# Patient Record
Sex: Male | Born: 2000 | Race: Black or African American | Hispanic: No | Marital: Single
Health system: Southern US, Community
[De-identification: ages and names within clinical notes are randomized; demographics above are authoritative.]

## PROBLEM LIST (undated history)

## (undated) DIAGNOSIS — J45909 Unspecified asthma, uncomplicated: Secondary | ICD-10-CM

## (undated) DIAGNOSIS — R569 Unspecified convulsions: Secondary | ICD-10-CM

## (undated) DIAGNOSIS — G039 Meningitis, unspecified: Secondary | ICD-10-CM

## (undated) HISTORY — PX: LUMBAR PUNCTURE: SHX1985

---
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
## 2021-08-25 ENCOUNTER — Ambulatory Visit: Payer: BC Managed Care – PPO

## 2022-04-28 ENCOUNTER — Emergency Department: Payer: BC Managed Care – PPO

## 2022-04-28 ENCOUNTER — Emergency Department
Admission: EM | Admit: 2022-04-28 | Discharge: 2022-04-28 | Disposition: A | Discharge status: Home / Self Care | Payer: BC Managed Care – PPO | Attending: Emergency Medicine | Admitting: Emergency Medicine

## 2022-04-28 ENCOUNTER — Other Ambulatory Visit: Payer: Self-pay

## 2022-04-28 DIAGNOSIS — M7918 Myalgia, other site: Secondary | ICD-10-CM | POA: Diagnosis not present

## 2022-04-28 DIAGNOSIS — T608X1A Toxic effect of other pesticides, accidental (unintentional), initial encounter: Secondary | ICD-10-CM | POA: Insufficient documentation

## 2022-04-28 DIAGNOSIS — R258 Other abnormal involuntary movements: Secondary | ICD-10-CM | POA: Insufficient documentation

## 2022-04-28 DIAGNOSIS — R569 Unspecified convulsions: Secondary | ICD-10-CM

## 2022-04-28 DIAGNOSIS — T6091XA Toxic effect of unspecified pesticide, accidental (unintentional), initial encounter: Secondary | ICD-10-CM

## 2022-04-28 LAB — BASIC METABOLIC PANEL
Anion gap: 10 (ref 5–15)
BUN: 13 mg/dL (ref 6–20)
CO2: 26 mmol/L (ref 22–32)
Calcium: 9.4 mg/dL (ref 8.9–10.3)
Chloride: 105 mmol/L (ref 98–111)
Creatinine, Ser: 1.16 mg/dL (ref 0.61–1.24)
GFR, Estimated: >60 mL/min (ref >60)
Glucose, Bld: 97 mg/dL (ref 70–99)
Potassium: 3.7 mmol/L (ref 3.5–5.1)
Sodium: 141 mmol/L (ref 135–145)

## 2022-04-28 LAB — URINALYSIS, ROUTINE W REFLEX MICROSCOPIC
Bacteria, UA: NONE SEEN
Bilirubin Urine: NEGATIVE
Glucose, UA: NEGATIVE mg/dL
Hgb urine dipstick: NEGATIVE
Ketones, ur: NEGATIVE mg/dL
Leukocytes,Ua: NEGATIVE
Nitrite: NEGATIVE
Protein, ur: 30 mg/dL — AB
Specific Gravity, Urine: 1.031 — ABNORMAL HIGH (ref 1.005–1.030)
Squamous Epithelial / HPF: NONE SEEN (ref 0–5)
pH: 7 (ref 5.0–8.0)

## 2022-04-28 LAB — CBC
HCT: 45.2 % (ref 39.0–52.0)
Hemoglobin: 15.3 g/dL (ref 13.0–17.0)
MCH: 29.9 pg (ref 26.0–34.0)
MCHC: 33.8 g/dL (ref 30.0–36.0)
MCV: 88.5 fL (ref 80.0–100.0)
Platelets: 229 10*3/uL (ref 150–400)
RBC: 5.11 MIL/uL (ref 4.22–5.81)
RDW: 11.9 % (ref 11.5–15.5)
WBC: 6.2 10*3/uL (ref 4.0–10.5)
nRBC: 0 % (ref 0.0–0.2)

## 2022-04-28 LAB — URINE DRUG SCREEN, QUALITATIVE (ARMC ONLY)
Amphetamines, Ur Screen: NOT DETECTED
Barbiturates, Ur Screen: NOT DETECTED
Benzodiazepine, Ur Scrn: NOT DETECTED
Cannabinoid 50 Ng, Ur ~~LOC~~: POSITIVE — AB
Cocaine Metabolite,Ur ~~LOC~~: NOT DETECTED
MDMA (Ecstasy)Ur Screen: NOT DETECTED
Methadone Scn, Ur: NOT DETECTED
Opiate, Ur Screen: NOT DETECTED
Phencyclidine (PCP) Ur S: NOT DETECTED
Tricyclic, Ur Screen: NOT DETECTED

## 2022-04-28 MED ORDER — LEVETIRACETAM 500 MG PO TABS: 500.0000 mg | ORAL_TABLET | Freq: Two times a day (BID) | ORAL | 0 refills | Status: AC

## 2022-04-28 MED ORDER — LEVETIRACETAM 500 MG PO TABS
500.0000 mg | ORAL_TABLET | Freq: Once | ORAL | Status: AC
  Administered 2022-04-28: 500 mg via ORAL
  Filled 2022-04-28: qty 1

## 2022-04-28 NOTE — Discharge Instructions (Signed)
Please use ibuprofen (Motrin) up to 800 mg every 8 hours, naproxen (Naprosyn) up to 500 mg every 12 hours, and/or acetaminophen (Tylenol) up to 4 g/day for any continued pain 

## 2022-04-28 NOTE — ED Provider Notes (Addendum)
[facility] Provider Note   Event Date/Time   First MD Initiated Contact with Patient 04/28/22 1658     (approximate) History  Loss of Consciousness  HPI Clinton Lane is a 21 y.o. 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  Family states that they set off a "bug bomb" in the house that caused an insecticide to be fogged all over.  Patient states that he was smoking marijuana and wanted to stay in the fog as he did not want to smoke outside.  Family then states that they heard patient yell from upstairs and found him on the ground having shaking activity in his upper and lower extremities as well as drooling from the mouth.  Per family, patient had slow return to neurologic baseline over the next 10-15 minutes and states that this was similar activity to the seizures that he had had as a child.  Patient now only complains of significant muscle pain worse in his thoracic and lumbar paraspinal musculature as he believes he fell backwards onto his back. ROS: Patient currently denies any vision changes, tinnitus, difficulty speaking, facial droop, sore throat, chest pain, shortness of breath, abdominal pain, nausea/vomiting/diarrhea, dysuria, or weakness/numbness/paresthesias in any extremity   Physical Exam  Triage Vital Signs: ED Triage Vitals  Enc Vitals Group     BP 04/28/22 1548 122/76     Pulse Rate 04/28/22 1548 70     Resp 04/28/22 1548 18     Temp 04/28/22 1548 98.5 F (36.9 C)     Temp Source 04/28/22 1548 Oral     SpO2 04/28/22 1548 95 %     Weight 04/28/22 1547 180 lb (81.6 kg)     Height 04/28/22 1547 6' (1.829 m)     Head Circumference --      Peak Flow --      Pain Score 04/28/22 1547 6     Pain Loc --      Pain Edu? --      Excl. in [place]? --    Most recent vital signs: Vitals:   04/28/22 1548  BP: 122/76  Pulse: 70  Resp: 18  Temp:  98.5 F (36.9 C)  SpO2: 95%   General: Awake, oriented x4. CV:  Good peripheral perfusion.  Resp:  Normal effort.  Abd:  No distention.  Other:  Young adult African-American male laying in bed in no acute distress.  NIHSS is 0 ED Results / Procedures / Treatments  Labs (all labs ordered are listed, but only abnormal results are displayed) Labs Reviewed  URINALYSIS, ROUTINE W REFLEX MICROSCOPIC - Abnormal; Notable for the following components:      Result Value   Color, Urine AMBER (*)    APPearance HAZY (*)    Specific Gravity, Urine 1.031 (*)    Protein, ur 30 (*)    All other components within normal limits  URINE DRUG SCREEN, QUALITATIVE ([facility] ONLY) - Abnormal; Notable for the following components:   Cannabinoid 50 Ng, Ur Lee POSITIVE (*)    All other components within normal limits  BASIC METABOLIC PANEL  CBC   RADIOLOGY ED MD interpretation: CT of the head without contrast interpreted by me shows no evidence of acute abnormalities including no intracerebral hemorrhage, obvious masses, or significant edema -Agree with radiology assessment Official radiology report(s): CT Head Wo Contrast  Result Date: 04/28/2022 CLINICAL DATA:  Headache, new  or worsening, post traumatic (Age 3-49y); Neck trauma, midline tenderness (Age 62-64y) EXAM: CT HEAD WITHOUT CONTRAST CT CERVICAL SPINE WITHOUT CONTRAST TECHNIQUE: Multidetector CT imaging of the head and cervical spine was performed following the standard protocol without intravenous contrast. Multiplanar CT image reconstructions of the cervical spine were also generated. RADIATION DOSE REDUCTION: This exam was performed according to the departmental dose-optimization program which includes automated exposure control, adjustment of the mA and/or kV according to patient size and/or use of iterative reconstruction technique. COMPARISON:  None Available. FINDINGS: CT HEAD FINDINGS Brain: No evidence of acute infarction, hemorrhage, or  hydrocephalus. 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 Vascular: No hyperdense vessel identified. Skull: No acute fracture. Sinuses/Orbits: Scattered paranasal sinus mucosal thickening. Other: No mastoid effusions. CT CERVICAL SPINE FINDINGS Alignment: Reversal the normal cervical lordosis. No substantial sagittal subluxation. Skull base and vertebrae: Vertebral body heights are maintained. No evidence of acute fracture. Soft tissues and spinal canal: No prevertebral fluid or swelling. No visible canal hematoma. Disc levels:  No significant focal bony degenerative change. Upper chest: Visualized lung apices are clear. IMPRESSION: 1. No evidence of acute intracranial abnormality. 2. No evidence of acute fracture or traumatic malalignment in the cervical spine. Electronically Signed   By: Feliberto Harts M.D.   On: 04/28/2022 16:18   CT Cervical Spine Wo Contrast  Result Date: 04/28/2022 CLINICAL DATA:  Headache, new or worsening, post traumatic (Age 57-49y); Neck trauma, midline tenderness (Age 72-64y) EXAM: CT HEAD WITHOUT CONTRAST CT CERVICAL SPINE WITHOUT CONTRAST TECHNIQUE: Multidetector CT imaging of the head and cervical spine was performed following the standard protocol without intravenous contrast. Multiplanar CT image reconstructions of the cervical spine were also generated. RADIATION DOSE REDUCTION: This exam was performed according to the departmental dose-optimization program which includes automated exposure control, adjustment of the mA and/or kV according to patient size and/or use of iterative reconstruction technique. COMPARISON:  None Available. FINDINGS: CT HEAD FINDINGS Brain: No evidence of acute infarction, hemorrhage, or hydrocephalus. 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 Vascular: No hyperdense vessel identified.  Skull: No acute fracture. Sinuses/Orbits: Scattered paranasal sinus mucosal thickening. Other: No mastoid effusions. CT CERVICAL SPINE FINDINGS Alignment: Reversal the normal cervical lordosis. No substantial sagittal subluxation. Skull base and vertebrae: Vertebral body heights are maintained. No evidence of acute fracture. Soft tissues and spinal canal: No prevertebral fluid or swelling. No visible canal hematoma. Disc levels:  No significant focal bony degenerative change. Upper chest: Visualized lung apices are clear. IMPRESSION: 1. No evidence of acute intracranial abnormality. 2. No evidence of acute fracture or traumatic malalignment in the cervical spine. Electronically Signed   By: Feliberto Harts M.D.   On: 04/28/2022 16:18   PROCEDURES: Critical Care performed: No .1-3 Lead EKG Interpretation  Performed by: Merwyn Katos, MD Authorized by: Merwyn Katos, MD     Interpretation: normal     ECG rate:  71   ECG rate assessment: normal     Rhythm: sinus rhythm     Ectopy: none     Conduction: normal    MEDICATIONS ORDERED IN ED: Medications  levETIRAcetam (KEPPRA) tablet 500 mg (has no administration in time range)   IMPRESSION / MDM / ASSESSMENT AND PLAN / ED COURSE  I reviewed the triage vital signs and the nursing notes.  The patient is on the cardiac monitor to evaluate for evidence of arrhythmia and/or significant heart rate changes. Patient's presentation is most consistent with acute presentation with potential threat to life or bodily function. Patient presents after recent seizure episode.  Patient had slow return to baseline mental and physical function per family. No immunosuppresion hx and had no preceding fever. No history of alcohol abuse or suspicion for toxin ingestion. Unlikely stroke, syncope. Unlikely infectious etiology. No preceding trauma.  Workup: EKG, BMP, POCT glucose (pregnancy test if male) and CT Brain.  Field  Interventions: None ED Interventions: 500 Keppra p.o.  In speaking to poison control, who they did relay that these types of insecticide fog can lower seizure threshold in people who are prone to seizures and recommend seizure prophylaxis for 1 week after exposure. Disposition: Discharge home with primary care follow up in next 24-48 hours.   FINAL CLINICAL IMPRESSION(S) / ED DIAGNOSES   Final diagnoses:  Seizure-like activity (HCC)  Toxic effect of insecticide, accidental or unintentional, initial encounter   Rx / DC Orders   ED Discharge Orders          Ordered    levETIRAcetam (KEPPRA) 500 MG tablet  2 times daily        04/28/22 1820           Note:  This document was prepared using Dragon voice recognition software and may include unintentional dictation errors.   Merwyn Katos, MD 04/28/22 Serena Croissant    Merwyn Katos, MD 04/28/22 [phone]

## 2022-04-28 NOTE — ED Triage Notes (Signed)
First nurse note: Patient arrived by EMS from home for syncopal episode. FD reports finding patient unresponsive. Responsive when EMS arrived. A&O x4. Patient reports smoking 2 grams marijuana.   61HR 94% RA 118/75 blood pressure 93 blood glucose

## 2022-04-28 NOTE — ED Provider Triage Note (Signed)
Emergency Medicine Provider Triage Evaluation Note  Clinton Lane , a 21 y.o. male  was evaluated in triage.  Pt complains of headache and neck pain after syncopal episode at home this afternoon. Mom states he was out about 5 minutes then woke up and had foam around his mouth. Seizure history as a child. Now has headache and neck pain.  Physical Exam  There were no vitals taken for this visit. Gen:   Awake, no distress   Resp:  Normal effort  MSK:   Moves extremities without difficulty  Other:    Medical Decision Making  Medically screening exam initiated at 3:47 PM.  Appropriate orders placed.  Clinton Lane was informed that the remainder of the evaluation will be completed by another provider, this initial triage assessment does not replace that evaluation, and the importance of remaining in the ED until their evaluation is complete.    Chinita Pester, FNP 04/28/22 1550

## 2022-04-28 NOTE — ED Triage Notes (Signed)
Pt arrives via EMS for a syncopal episode that lasted about 5 minutes per family- per family pt did having some shaking and foaming at the mouth- pt had seizures when he was younger but has not had any in a long time- pt having pain in his neck from the fall

## 2022-05-25 ENCOUNTER — Emergency Department: Payer: BC Managed Care – PPO

## 2022-05-25 ENCOUNTER — Emergency Department
Admission: EM | Admit: 2022-05-25 | Discharge: 2022-05-25 | Disposition: A | Discharge status: Home / Self Care | Payer: BC Managed Care – PPO | Attending: Emergency Medicine | Admitting: Emergency Medicine

## 2022-05-25 ENCOUNTER — Other Ambulatory Visit: Payer: Self-pay

## 2022-05-25 DIAGNOSIS — R4182 Altered mental status, unspecified: Secondary | ICD-10-CM | POA: Insufficient documentation

## 2022-05-25 DIAGNOSIS — Z20822 Contact with and (suspected) exposure to covid-19: Secondary | ICD-10-CM | POA: Insufficient documentation

## 2022-05-25 DIAGNOSIS — R55 Syncope and collapse: Secondary | ICD-10-CM | POA: Diagnosis present

## 2022-05-25 LAB — URINE DRUG SCREEN, QUALITATIVE (ARMC ONLY)
Amphetamines, Ur Screen: NOT DETECTED
Barbiturates, Ur Screen: NOT DETECTED
Benzodiazepine, Ur Scrn: NOT DETECTED
Cannabinoid 50 Ng, Ur ~~LOC~~: POSITIVE — AB
Cocaine Metabolite,Ur ~~LOC~~: NOT DETECTED
MDMA (Ecstasy)Ur Screen: NOT DETECTED
Methadone Scn, Ur: NOT DETECTED
Opiate, Ur Screen: NOT DETECTED
Phencyclidine (PCP) Ur S: NOT DETECTED
Tricyclic, Ur Screen: NOT DETECTED

## 2022-05-25 LAB — CBC WITH DIFFERENTIAL/PLATELET
Abs Immature Granulocytes: 0.04 10*3/uL (ref 0.00–0.07)
Basophils Absolute: 0 10*3/uL (ref 0.0–0.1)
Basophils Relative: 1 %
Eosinophils Absolute: 0.2 10*3/uL (ref 0.0–0.5)
Eosinophils Relative: 3 %
HCT: 45.4 % (ref 39.0–52.0)
Hemoglobin: 15 g/dL (ref 13.0–17.0)
Immature Granulocytes: 1 %
Lymphocytes Relative: 43 %
Lymphs Abs: 2.7 10*3/uL (ref 0.7–4.0)
MCH: 29.7 pg (ref 26.0–34.0)
MCHC: 33 g/dL (ref 30.0–36.0)
MCV: 89.9 fL (ref 80.0–100.0)
Monocytes Absolute: 0.5 10*3/uL (ref 0.1–1.0)
Monocytes Relative: 8 %
Neutro Abs: 2.6 10*3/uL (ref 1.7–7.7)
Neutrophils Relative %: 44 %
Platelets: 202 10*3/uL (ref 150–400)
RBC: 5.05 MIL/uL (ref 4.22–5.81)
RDW: 12.5 % (ref 11.5–15.5)
WBC: 6 10*3/uL (ref 4.0–10.5)
nRBC: 0 % (ref 0.0–0.2)

## 2022-05-25 LAB — COMPREHENSIVE METABOLIC PANEL
ALT: 27 U/L (ref 0–44)
AST: 29 U/L (ref 15–41)
Albumin: 4.3 g/dL (ref 3.5–5.0)
Alkaline Phosphatase: 43 U/L (ref 38–126)
Anion gap: 7 (ref 5–15)
BUN: 13 mg/dL (ref 6–20)
CO2: 26 mmol/L (ref 22–32)
Calcium: 9.3 mg/dL (ref 8.9–10.3)
Chloride: 110 mmol/L (ref 98–111)
Creatinine, Ser: 1.04 mg/dL (ref 0.61–1.24)
GFR, Estimated: >60 mL/min (ref >60)
Glucose, Bld: 95 mg/dL (ref 70–99)
Potassium: 3.4 mmol/L — ABNORMAL LOW (ref 3.5–5.1)
Sodium: 143 mmol/L (ref 135–145)
Total Bilirubin: 0.5 mg/dL (ref 0.3–1.2)
Total Protein: 7.3 g/dL (ref 6.5–8.1)

## 2022-05-25 LAB — URINALYSIS, COMPLETE (UACMP) WITH MICROSCOPIC
Bilirubin Urine: NEGATIVE
Glucose, UA: NEGATIVE mg/dL
Hgb urine dipstick: NEGATIVE
Ketones, ur: NEGATIVE mg/dL
Leukocytes,Ua: NEGATIVE
Nitrite: NEGATIVE
Protein, ur: 30 mg/dL — AB
Specific Gravity, Urine: 1.028 (ref 1.005–1.030)
pH: 6 (ref 5.0–8.0)

## 2022-05-25 LAB — LACTIC ACID, PLASMA: Lactic Acid, Venous: 1.3 mmol/L (ref 0.5–1.9)

## 2022-05-25 LAB — ETHANOL: Alcohol, Ethyl (B): <10 mg/dL (ref <10)

## 2022-05-25 LAB — PROTIME-INR
INR: 1.1 (ref 0.8–1.2)
Prothrombin Time: 13.6 seconds (ref 11.4–15.2)

## 2022-05-25 LAB — APTT: aPTT: 31 seconds (ref 24–36)

## 2022-05-25 LAB — RESP PANEL BY RT-PCR (FLU A&B, COVID) ARPGX2
Influenza A by PCR: NEGATIVE
Influenza B by PCR: NEGATIVE
SARS Coronavirus 2 by RT PCR: NEGATIVE

## 2022-05-25 MED ORDER — ACETAMINOPHEN 500 MG PO TABS
1000.0000 mg | ORAL_TABLET | Freq: Once | ORAL | Status: AC
Start: 2022-05-25 — End: 2022-05-25
  Administered 2022-05-25: 1000 mg via ORAL
  Filled 2022-05-25: qty 2

## 2022-05-25 MED ORDER — LACTATED RINGERS IV BOLUS
1000.0000 mL | Freq: Once | INTRAVENOUS | Status: AC
  Administered 2022-05-25: 1000 mL via INTRAVENOUS

## 2022-05-25 MED ORDER — LACTATED RINGERS IV BOLUS (SEPSIS)
1000.0000 mL | Freq: Once | INTRAVENOUS | Status: AC
Start: 2022-05-25 — End: 2022-05-25
  Administered 2022-05-25: 1000 mL via INTRAVENOUS

## 2022-05-25 NOTE — ED Provider Notes (Signed)
Procedures     ----------------------------------------- 7:09 PM on 05/25/2022 ----------------------------------------- Patient's mental status has quickly normalized with IV fluids in the ED.  Further and additional observation period of a few hours, he is remained normal.  He is tolerating oral intake.  Mental status and speech are normal, no meningismus.  No fever.  He feels back to normal.  CT head is unremarkable.  Labs unremarkable.  COVID and flu negative.  I discussed with him the possibility of an undiagnosed seizure disorder.  Seizure precautions given.  Will refer to neurology for further evaluation.  Encourage good sleep and hydration.  Patient voices understanding, denies additional concerns or needs at this time.     Sharman Cheek, MD 05/25/22 1910

## 2022-05-25 NOTE — ED Triage Notes (Signed)
Pt arrived via EMS presenting with syncopal episode lasting over 10 minutes. Family reports he walked into room and fell over. Vitals via EMS were: BG 94, 13-30 RR, NSR HR=70, 100.2 F, BP 144/54. Family reports hx seizures but denies seizure activity since onset of symptoms. Patient is complaining of neck stiffness and cough/SOB over 4-5 days.

## 2022-05-25 NOTE — ED Provider Notes (Signed)
[facility] Provider Note    Event Date/Time   First MD Initiated Contact with Patient 05/25/22 1427     (approximate)   History   Altered Mental Status and Loss of Consciousness   HPI  Clinton Lane is a 21 y.o. male   Past medical history of febrile seizures who presents today from home after family called EMS for a syncopal event he was walking into the room and fell over, loss of consciousness approximately 10 minutes.  No seizure activity reported.  He has been feeling unwell for the last 4 to 5 days with cough, shortness of breath, myalgias and neck stiffness.  Arrives somnolent, protecting airway, mumbles and is able to make meaningful movement of his upper extremities track me with his eyes when I open them.  History was obtained via EMS personnel and the patient.      Physical Exam   Triage Vital Signs: ED Triage Vitals [05/25/22 1427]  Enc Vitals Group     BP      Pulse Rate 68     Resp 14     Temp 99.3 F (37.4 C)     Temp Source Oral     SpO2 98 %     Weight      Height      Head Circumference      Peak Flow      Pain Score      Pain Loc      Pain Edu?      Excl. in [place]?     Most recent vital signs: Vitals:   05/25/22 1427 05/25/22 1430  BP:  129/74  Pulse: 68 65  Resp: 14 13  Temp: 99.3 F (37.4 C)   SpO2: 98% 100%    General: Somnolent, but arousable to verbal stimulus.  Moving all extremities CV:  Not appear remarkably clinically dehydrated on my exam Resp:  Normal effort.  Breath sounds clear to auscultation without focality or wheezing. Abd:  No distention.  Soft and nontender. Other:  He is somnolent, is febrile with a low-grade fever, and is able to move all extremities and rotate his neck from side to side voluntarily.   ED Results / Procedures / Treatments   Labs (all labs ordered are listed, but only abnormal results are displayed) Labs Reviewed  CULTURE, BLOOD (ROUTINE X 2)  CULTURE, BLOOD  (ROUTINE X 2)  URINE CULTURE  RESP PANEL BY RT-PCR (FLU A&B, COVID) ARPGX2  CBC WITH DIFFERENTIAL/PLATELET  LACTIC ACID, PLASMA  LACTIC ACID, PLASMA  COMPREHENSIVE METABOLIC PANEL  PROTIME-INR  APTT  URINALYSIS, COMPLETE (UACMP) WITH MICROSCOPIC  ETHANOL  URINE DRUG SCREEN, QUALITATIVE ([facility] ONLY)     I reviewed labs and they are notable for WBC 6.0  EKG  ED ECG REPORT I, Pilar Jarvis, the attending physician, personally viewed and interpreted this ECG.   Date: 05/25/2022  EKG Time: 1425  Rate: 61  Rhythm: normal EKG, normal sinus rhythm  Axis: normal  Intervals:none  ST&T Change: no ischemic changes, likely early repolarization     PROCEDURES:  Critical Care performed: No  Procedures   MEDICATIONS ORDERED IN ED: Medications  lactated ringers bolus 1,000 mL (1,000 mLs Intravenous New Bag/Given 05/25/22 1448)  acetaminophen (TYLENOL) tablet 1,000 mg (has no administration in time range)    IMPRESSION / MDM / ASSESSMENT AND PLAN / ED COURSE  I reviewed the triage vital signs and the nursing notes.  Differential diagnosis includes, but is not limited to, URI, sepsis, bacterial pna, meningitis, syncope, seizure, intracranial bleeding  MDM:  Patient with several days of upper respiratory infectious symptoms likely URI assess for sepsis, syncope eval w labs / ecg and CT head for syncope and somnolence. Consider post-ictal for febrile sz thought pt only had this dx in childhood and no sz activity today reported by family.   Considered meningitis but patient without meningismus on exam, neg kernig/brudzinski and had similar presentation of somnolence with URI +FLU in 2019 where he was somnolent but responded very well to ED treatment. Defer LP right weighing risks and benefits of procedure in favor first to obtain CT head and see if identifiable source on initial w/u, assess response to treatment.   Pt reassessed 2:55 PM and improved mentation  with family in room, able to respond to questions and nod yes to taking tylenol and when asked to provide UA.   Signed out to oncoming provider pending w/u.   Patient's presentation is most consistent with acute complicated illness / injury requiring diagnostic workup.       FINAL CLINICAL IMPRESSION(S) / ED DIAGNOSES   Final diagnoses:  None     Rx / DC Orders   ED Discharge Orders     None        Note:  This document was prepared using Dragon voice recognition software and may include unintentional dictation errors.    Pilar Jarvis, MD 05/25/22 1455

## 2022-05-27 LAB — URINE CULTURE
Culture: <10000 — AB
Special Requests: NORMAL

## 2022-05-30 LAB — CULTURE, BLOOD (ROUTINE X 2)
Culture: NO GROWTH
Culture: NO GROWTH
Special Requests: ADEQUATE
Special Requests: ADEQUATE

## 2023-05-25 ENCOUNTER — Emergency Department: Payer: BC Managed Care – PPO

## 2023-05-25 ENCOUNTER — Emergency Department
Admission: EM | Admit: 2023-05-25 | Discharge: 2023-05-25 | Disposition: A | Discharge status: Home / Self Care | Payer: BC Managed Care – PPO | Attending: Emergency Medicine | Admitting: Emergency Medicine

## 2023-05-25 DIAGNOSIS — S51832A Puncture wound without foreign body of left forearm, initial encounter: Secondary | ICD-10-CM | POA: Diagnosis not present

## 2023-05-25 DIAGNOSIS — W540XXA Bitten by dog, initial encounter: Secondary | ICD-10-CM | POA: Diagnosis not present

## 2023-05-25 DIAGNOSIS — Z23 Encounter for immunization: Secondary | ICD-10-CM | POA: Insufficient documentation

## 2023-05-25 DIAGNOSIS — S59912A Unspecified injury of left forearm, initial encounter: Secondary | ICD-10-CM | POA: Diagnosis present

## 2023-05-25 MED ORDER — TRAMADOL HCL 50 MG PO TABS: 50.0000 mg | ORAL_TABLET | Freq: Four times a day (QID) | ORAL | 0 refills | Status: AC | PRN

## 2023-05-25 MED ORDER — DOXYCYCLINE MONOHYDRATE 100 MG PO TABS: 100.0000 mg | ORAL_TABLET | Freq: Two times a day (BID) | ORAL | 0 refills | Status: AC

## 2023-05-25 MED ORDER — TETANUS-DIPHTH-ACELL PERTUSSIS 5-2.5-18.5 LF-MCG/0.5 IM SUSY
0.5000 mL | PREFILLED_SYRINGE | Freq: Once | INTRAMUSCULAR | Status: AC
  Administered 2023-05-25: 0.5 mL via INTRAMUSCULAR
  Filled 2023-05-25: qty 0.5

## 2023-05-25 MED ORDER — DOXYCYCLINE HYCLATE 100 MG PO TABS
100.0000 mg | ORAL_TABLET | Freq: Once | ORAL | Status: AC
  Administered 2023-05-25: 100 mg via ORAL
  Filled 2023-05-25: qty 1

## 2023-05-25 MED ORDER — METRONIDAZOLE 500 MG PO TABS
500.0000 mg | ORAL_TABLET | Freq: Once | ORAL | Status: AC
  Administered 2023-05-25: 500 mg via ORAL
  Filled 2023-05-25: qty 1

## 2023-05-25 MED ORDER — HYDROCODONE-ACETAMINOPHEN 5-325 MG PO TABS
1.0000 | ORAL_TABLET | ORAL | Status: AC
  Administered 2023-05-25: 1 via ORAL
  Filled 2023-05-25: qty 1

## 2023-05-25 MED ORDER — METRONIDAZOLE 500 MG PO TABS: 500.0000 mg | ORAL_TABLET | Freq: Three times a day (TID) | ORAL | 0 refills | Status: AC

## 2023-05-25 NOTE — ED Provider Notes (Signed)
[facility] Provider Note   CSN: [id number] Arrival date & time: 05/25/23  1427     History  Chief Complaint  Patient presents with   Animal Bite    Clinton Lane is a 22 y.o. male.  Presents to the emergency department valuation of a dog bite, his cousin's dog bit him in the left arm just prior to arrival.  Tetanus status unknown.  Dog's vaccination status unknown.  Animal control notified and dog is in possession of animal control.  Patient has 2 puncture wounds to left forearm along with abrasions throughout the forearm.  HPI     Home Medications Prior to Admission medications   Medication Sig Start Date End Date Taking? Authorizing Provider  doxycycline (ADOXA) 100 MG tablet Take 1 tablet (100 mg total) by mouth 2 (two) times daily for 7 days. 05/25/23 06/01/23 Yes Evon Slack, PA-C  metroNIDAZOLE (FLAGYL) 500 MG tablet Take 1 tablet (500 mg total) by mouth 3 (three) times daily for 7 days. 05/25/23 06/01/23 Yes Evon Slack, PA-C  traMADol (ULTRAM) 50 MG tablet Take 1 tablet (50 mg total) by mouth every 6 (six) hours as needed. 05/25/23  Yes Evon Slack, PA-C  albuterol (PROVENTIL HFA;VENTOLIN HFA) 108 (90 Base) MCG/ACT inhaler Inhale into the lungs every 6 (six) hours as needed for wheezing or shortness of breath. Patient not taking: Reported on 05/25/2022    [provider]  Alum & Mag Hydroxide-Simeth (GI COCKTAIL) SUSP suspension Take 30 mLs by mouth 2 (two) times daily as needed (pain). Shake well. Patient not taking: Reported on 05/25/2022 05/18/16   Minna Antis, MD  azithromycin (ZITHROMAX) 250 MG tablet Take 1 tablet (250 mg total) by mouth daily. Patient not taking: Reported on 05/25/2022 09/01/18   Irean Hong, MD  fluticasone [facility]) 50 MCG/ACT nasal spray Place into both nostrils daily. Patient not taking: Reported on 05/25/2022    [provider]  HYDROcodone-acetaminophen (HYCET) 7.5-325 mg/15  ml solution Take 10 mLs by mouth 4 (four) times daily as needed for moderate pain. Patient not taking: Reported on 05/25/2022 05/17/16   Bud Face, MD  levETIRAcetam (KEPPRA) 500 MG tablet Take 1 tablet (500 mg total) by mouth 2 (two) times daily. Patient not taking: Reported on 05/25/2022 04/28/22   Merwyn Katos, MD  lidocaine (XYLOCAINE) 2 % solution Use as directed 10 mLs in the mouth or throat every 6 (six) hours as needed for mouth pain (Swish and spit). Patient not taking: Reported on 05/25/2022 05/17/16   Bud Face, MD  ondansetron (ZOFRAN ODT) 4 MG disintegrating tablet Take 1 tablet (4 mg total) by mouth every 8 (eight) hours as needed for nausea or vomiting. Patient not taking: Reported on 05/25/2022 05/18/16   Minna Antis, MD  oseltamivir (TAMIFLU) 75 MG capsule Take 1 capsule (75 mg total) by mouth 2 (two) times daily. Patient not taking: Reported on 05/25/2022 09/01/18   Irean Hong, MD  oxyCODONE (ROXICODONE) 5 MG/5ML solution Take 5 mLs (5 mg total) by mouth every 6 (six) hours as needed for moderate pain or severe pain. Patient not taking: Reported on 05/25/2022 05/21/16   Minna Antis, MD  promethazine (PHENERGAN) 12.5 MG tablet Take 1 tablet (12.5 mg total) by mouth every 6 (six) hours as needed for nausea or vomiting. Patient not taking: Reported on 05/25/2022 05/17/16   Bud Face, MD      Allergies    Aspirin, Augmentin [amoxicillin-pot clavulanate], and Penicillins  Review of Systems   Review of Systems  Physical Exam Updated Vital Signs BP (!) 140/99   Pulse 96   Temp 98.6 F (37 C) (Oral)   Resp 17   Ht 6' (1.829 m)   SpO2 97%   BMI 25.09 kg/m  Physical Exam Constitutional:      Appearance: He is well-developed.  HENT:     Head: Normocephalic and atraumatic.  Eyes:     Conjunctiva/sclera: Conjunctivae normal.  Cardiovascular:     Rate and Rhythm: Normal rate.  Pulmonary:     Effort: Pulmonary effort is normal. No respiratory  distress.  Abdominal:     General: There is no distension.     Tenderness: There is no abdominal tenderness. There is no guarding.  Musculoskeletal:        General: Normal range of motion.     Cervical back: Normal range of motion.     Comments: Superficial abrasions left forearm and hand with 2 puncture wounds 1 along the volar and dorsal aspect of the forearm.  Extensor and flexor tendons are intact.  Neuro vas intact.  Compartments are soft.  Wound thoroughly irrigated and cleansed with Betadine and saline and dressings applied.  Skin:    General: Skin is warm.     Findings: No rash.  Neurological:     Mental Status: He is alert and oriented to person, place, and time.  Psychiatric:        Behavior: Behavior normal.        Thought Content: Thought content normal.     ED Results / Procedures / Treatments   Labs (all labs ordered are listed, but only abnormal results are displayed) Labs Reviewed - No data to display  EKG None  Radiology No results found.  Procedures Procedures    Medications Ordered in ED Medications  Tdap (BOOSTRIX) injection 0.5 mL (0.5 mLs Intramuscular Given 05/25/23 1639)  doxycycline (VIBRA-TABS) tablet 100 mg (100 mg Oral Given 05/25/23 1635)  metroNIDAZOLE (FLAGYL) tablet 500 mg (500 mg Oral Given 05/25/23 1635)  HYDROcodone-acetaminophen (NORCO/VICODIN) 5-325 MG per tablet 1 tablet (1 tablet Oral Given 05/25/23 1635)    ED Course/ Medical Decision Making/ A&P                                 Medical Decision Making Amount and/or Complexity of Data Reviewed Radiology: ordered.  Risk Prescription drug management.   22 year old male with dog bite to left forearm.  Superficial abrasions and 2 small puncture wounds.  Tetanus updated.  Dog is in hands of animal control and being quarantined.  X-ray of the left forearm negative for fracture or foreign body.  Tetanus updated here in the ED and patient placed on doxycycline and metronidazole due to  penicillin allergy.  He understands signs symptoms return to the ER for. Final Clinical Impression(s) / ED Diagnoses Final diagnoses:  Dog bite, initial encounter  Puncture wound of left forearm, initial encounter    Rx / DC Orders ED Discharge Orders          Ordered    doxycycline (ADOXA) 100 MG tablet  2 times daily        05/25/23 1703    metroNIDAZOLE (FLAGYL) 500 MG tablet  3 times daily        05/25/23 1703    traMADol (ULTRAM) 50 MG tablet  Every 6 hours PRN  05/25/23 1703              Evon Slack, PA-C 05/25/23 1707    Merwyn Katos, MD 05/25/23 [phone]

## 2023-05-25 NOTE — ED Triage Notes (Signed)
Pt here via [facility] from home post dog bite. Pt has small lacerations on his left forearm, left thigh, and back of the right leg and other superficial bites. Pt has hx of seizures. Pt was bit at:  [address] [place], [place]  Dog belongs to his cousin.

## 2023-05-25 NOTE — Discharge Instructions (Signed)
You may shower and get wounds wet.  Cleanse with a good anti and bacterial soap daily.  Apply antibiotic ointment daily.  Take antibiotics as prescribed.  Return to the ER for any increasing pain swelling warmth redness or drainage

## 2023-05-25 NOTE — ED Notes (Signed)
Clinton Lane is aware of situation.

## 2023-05-28 NOTE — Group Note (Deleted)

## 2023-07-17 ENCOUNTER — Other Ambulatory Visit: Payer: Self-pay

## 2023-07-17 ENCOUNTER — Emergency Department: Payer: BC Managed Care – PPO

## 2023-07-17 ENCOUNTER — Emergency Department
Admission: EM | Admit: 2023-07-17 | Discharge: 2023-07-17 | Disposition: A | Discharge status: Home / Self Care | Payer: BC Managed Care – PPO | Attending: Emergency Medicine | Admitting: Emergency Medicine

## 2023-07-17 DIAGNOSIS — R569 Unspecified convulsions: Secondary | ICD-10-CM

## 2023-07-17 DIAGNOSIS — R258 Other abnormal involuntary movements: Secondary | ICD-10-CM | POA: Diagnosis present

## 2023-07-17 LAB — CBG MONITORING, ED: Glucose-Capillary: 86 mg/dL (ref 70–99)

## 2023-07-17 LAB — CBC
HCT: 43.9 % (ref 39.0–52.0)
Hemoglobin: 14.9 g/dL (ref 13.0–17.0)
MCH: 30 pg (ref 26.0–34.0)
MCHC: 33.9 g/dL (ref 30.0–36.0)
MCV: 88.3 fL (ref 80.0–100.0)
Platelets: 207 10*3/uL (ref 150–400)
RBC: 4.97 MIL/uL (ref 4.22–5.81)
RDW: 11.9 % (ref 11.5–15.5)
WBC: 5.5 10*3/uL (ref 4.0–10.5)
nRBC: 0 % (ref 0.0–0.2)

## 2023-07-17 LAB — BASIC METABOLIC PANEL
Anion gap: 9 (ref 5–15)
BUN: 13 mg/dL (ref 6–20)
CO2: 26 mmol/L (ref 22–32)
Calcium: 9 mg/dL (ref 8.9–10.3)
Chloride: 103 mmol/L (ref 98–111)
Creatinine, Ser: 1.01 mg/dL (ref 0.61–1.24)
GFR, Estimated: >60 mL/min (ref >60)
Glucose, Bld: 98 mg/dL (ref 70–99)
Potassium: 2.9 mmol/L — ABNORMAL LOW (ref 3.5–5.1)
Sodium: 138 mmol/L (ref 135–145)

## 2023-07-17 LAB — HEPATIC FUNCTION PANEL
ALT: 27 U/L (ref 0–44)
AST: 25 U/L (ref 15–41)
Albumin: 4.4 g/dL (ref 3.5–5.0)
Alkaline Phosphatase: 38 U/L (ref 38–126)
Bilirubin, Direct: 0.2 mg/dL (ref 0.0–0.2)
Indirect Bilirubin: 0.7 mg/dL (ref 0.3–0.9)
Total Bilirubin: 0.9 mg/dL (ref 0.3–1.2)
Total Protein: 7.1 g/dL (ref 6.5–8.1)

## 2023-07-17 LAB — URINE DRUG SCREEN, QUALITATIVE (ARMC ONLY)
Amphetamines, Ur Screen: NOT DETECTED
Barbiturates, Ur Screen: NOT DETECTED
Benzodiazepine, Ur Scrn: POSITIVE — AB
Cannabinoid 50 Ng, Ur ~~LOC~~: POSITIVE — AB
Cocaine Metabolite,Ur ~~LOC~~: NOT DETECTED
MDMA (Ecstasy)Ur Screen: NOT DETECTED
Methadone Scn, Ur: NOT DETECTED
Opiate, Ur Screen: NOT DETECTED
Phencyclidine (PCP) Ur S: NOT DETECTED
Tricyclic, Ur Screen: NOT DETECTED

## 2023-07-17 LAB — MAGNESIUM: Magnesium: 1.9 mg/dL (ref 1.7–2.4)

## 2023-07-17 MED ORDER — POTASSIUM CHLORIDE CRYS ER 20 MEQ PO TBCR: 20.0000 meq | EXTENDED_RELEASE_TABLET | Freq: Two times a day (BID) | ORAL | 0 refills | Status: AC

## 2023-07-17 MED ORDER — POTASSIUM CHLORIDE 10 MEQ/100ML IV SOLN: 10.0000 meq | Freq: Once | INTRAVENOUS | Status: DC

## 2023-07-17 MED ORDER — POTASSIUM CHLORIDE CRYS ER 20 MEQ PO TBCR
40.0000 meq | EXTENDED_RELEASE_TABLET | Freq: Once | ORAL | Status: AC
  Administered 2023-07-17: 40 meq via ORAL
  Filled 2023-07-17: qty 2

## 2023-07-17 MED ORDER — SODIUM CHLORIDE 0.9 % IV BOLUS
500.0000 mL | Freq: Once | INTRAVENOUS | Status: AC
  Administered 2023-07-17: 500 mL via INTRAVENOUS

## 2023-07-17 NOTE — Discharge Instructions (Signed)
You were seen in the ER today after a seizure-like episode. We do not typically start someone on medicine for seizures after a single episode. However, it is important that you contact a neurologist to arrange follow-up for further evaluation of your episode.  Please do not drive, swim or bathe unsupervised, or climb to heights for six months or until otherwise instructed by your doctor. Return to the ER for recurrent seizures or for other new or concerning symptoms.   Your potassium level was slightly low here.  I sent a prescription for supplemental potassium for the next few days to your pharmacy.  Return to the ER for new or worsening symptoms.

## 2023-07-17 NOTE — ED Notes (Signed)
BP noted not reading on central monitor. Assigned RN called and made aware to check and ensure placement.

## 2023-07-17 NOTE — ED Triage Notes (Signed)
Patient brought in by EMS from home; Family called stating that patient had a 20 minute seizure prior to EMS arrival; No incontinence of urine/stool noted, no injury to mouth or airway, reddened sclera noted (EMR shows that patient smokes marijuana); Patient was given Versed 2 mg IM by EMS en route to hospital; CBG 86; Patient responds to painful stimuli, maintains patent airway with regular and unlabored respirations

## 2023-07-17 NOTE — ED Provider Notes (Signed)
[facility] Provider Note    Event Date/Time   First MD Initiated Contact with Patient 07/17/23 1941     (approximate)   History   Seizure Like Activity   HPI  Keithen Bengel is a 22 year old male presenting to the emergency department for evaluation of seizure-like activity.  Patient reportedly had a 20-minute period with intermittent shaking concerning for possible seizure.  No tongue biting, bowel or bladder incontinence noted.  Patient was given 2 mg of Versed with EMS.  CBG 86.  Accompanied by dad who reports that patient has a couple episodes of possible seizure-like activity previously, most recently a few years ago.  Not on any AEDs.    Physical Exam   Triage Vital Signs: ED Triage Vitals  Encounter Vitals Group     BP 07/17/23 1937 124/81     Systolic BP Percentile --      Diastolic BP Percentile --      Pulse Rate 07/17/23 1937 66     Resp 07/17/23 1937 16     Temp 07/17/23 1937 97.7 F (36.5 C)     Temp Source 07/17/23 1937 Axillary     SpO2 07/17/23 1937 99 %     Weight 07/17/23 1938 195 lb 9.6 oz (88.7 kg)     Height 07/17/23 1938 6' (1.829 m)     Head Circumference --      Peak Flow --      Pain Score --      Pain Loc --      Pain Education --      Exclude from Growth Chart --     Most recent vital signs: Vitals:   07/17/23 1937 07/17/23 2230  BP: 124/81 111/66  Pulse: 66 (!) 56  Resp: 16 17  Temp: 97.7 F (36.5 C)   SpO2: 99% 99%     General: Somnolent, but arousable to painful stimuli CV:  Regular rate, good peripheral perfusion.  Resp:  Unlabored respirations.  Abd:  Nondistended.  Neuro:  Initially not following commands and somnolent, but on reevaluation, awake, 5 out of 5 strength in bilateral upper and lower extremities with normal sensation, normal extraocular movements, no focal deficits noted  ED Results / Procedures / Treatments   Labs (all labs ordered are listed, but only abnormal results are  displayed) Labs Reviewed  BASIC METABOLIC PANEL - Abnormal; Notable for the following components:      Result Value   Potassium 2.9 (*)    All other components within normal limits  URINE DRUG SCREEN, QUALITATIVE ([facility] ONLY) - Abnormal; Notable for the following components:   Cannabinoid 50 Ng, Ur [place] POSITIVE (*)    Benzodiazepine, Ur Scrn POSITIVE (*)    All other components within normal limits  CBC  HEPATIC FUNCTION PANEL  MAGNESIUM  CBG MONITORING, ED     EKG EKG independently reviewed interpreted by myself (ER attending) demonstrates:  EKG and straight sinus rhythm at a rate of 64, PR 153, QRS 92, QTc 413, no acute ST changes  RADIOLOGY Imaging independently reviewed and interpreted by myself demonstrates:  CT head without acute bleed  PROCEDURES:  Critical Care performed: No  Procedures   MEDICATIONS ORDERED IN ED: Medications  sodium chloride 0.9 % bolus 500 mL (0 mLs Intravenous Stopped 07/17/23 2016)  potassium chloride SA (KLOR-CON M) CR tablet 40 mEq (40 mEq Oral Given 07/17/23 2222)     IMPRESSION / MDM / ASSESSMENT AND PLAN / ED COURSE  I reviewed the triage vital signs and the nursing notes.  Differential diagnosis includes, but is not limited to, seizure, convulsive syncope, electrolyte abnormality, substance tox occasion  Patient's presentation is most consistent with acute presentation with potential threat to life or bodily function.  22 year old male presenting following a seizure-like episode.  Initially somnolent here, but did return to baseline without focal deficits.  Labs with hypokalemia, orally repleted, will DC with a couple additional days of potassium.  CT head and remainder of labs reassuring.  UDS positive for THC and benzos, but did receive Versed with EMS.  Discussed with patient that is important he follows up with neurology for further evaluation.  While patient has had seizure-like activity previously, it is been several years since  this happened, so I do think it is reasonable to hold off on AED initiation from the ER.  Patient is comfortable this plan.  Strict return precautions were provided as well as seizure precautions.  Patient was discharged in stable condition.      FINAL CLINICAL IMPRESSION(S) / ED DIAGNOSES   Final diagnoses:  None     Rx / DC Orders   ED Discharge Orders          Ordered    potassium chloride SA (KLOR-CON M) 20 MEQ tablet  2 times daily        07/17/23 2350             Note:  This document was prepared using Dragon voice recognition software and may include unintentional dictation errors.   Trinna Post, MD 07/17/23 2350

## 2024-09-20 ENCOUNTER — Emergency Department

## 2024-09-20 ENCOUNTER — Encounter: Payer: Self-pay | Admitting: Emergency Medicine

## 2024-09-20 ENCOUNTER — Emergency Department
Admission: EM | Admit: 2024-09-20 | Discharge: 2024-09-20 | Disposition: A | Discharge status: Home / Self Care | Attending: Emergency Medicine | Admitting: Emergency Medicine

## 2024-09-20 ENCOUNTER — Other Ambulatory Visit: Payer: Self-pay

## 2024-09-20 DIAGNOSIS — R110 Nausea: Secondary | ICD-10-CM | POA: Diagnosis not present

## 2024-09-20 DIAGNOSIS — R1013 Epigastric pain: Secondary | ICD-10-CM | POA: Insufficient documentation

## 2024-09-20 LAB — COMPREHENSIVE METABOLIC PANEL WITH GFR
ALT: 23 U/L (ref 0–44)
AST: 25 U/L (ref 15–41)
Albumin: 4.7 g/dL (ref 3.5–5.0)
Alkaline Phosphatase: 51 U/L (ref 38–126)
Anion gap: 10 (ref 5–15)
BUN: 9 mg/dL (ref 6–20)
CO2: 26 mmol/L (ref 22–32)
Calcium: 9.7 mg/dL (ref 8.9–10.3)
Chloride: 105 mmol/L (ref 98–111)
Creatinine, Ser: 1.05 mg/dL (ref 0.61–1.24)
GFR, Estimated: >60 mL/min
Glucose, Bld: 96 mg/dL (ref 70–99)
Potassium: 4 mmol/L (ref 3.5–5.1)
Sodium: 141 mmol/L (ref 135–145)
Total Bilirubin: 0.4 mg/dL (ref 0.0–1.2)
Total Protein: 7.2 g/dL (ref 6.5–8.1)

## 2024-09-20 LAB — CBC
HCT: 47.5 % (ref 39.0–52.0)
Hemoglobin: 15.9 g/dL (ref 13.0–17.0)
MCH: 29.8 pg (ref 26.0–34.0)
MCHC: 33.5 g/dL (ref 30.0–36.0)
MCV: 89 fL (ref 80.0–100.0)
Platelets: 222 K/uL (ref 150–400)
RBC: 5.34 MIL/uL (ref 4.22–5.81)
RDW: 12 % (ref 11.5–15.5)
WBC: 5.8 K/uL (ref 4.0–10.5)
nRBC: 0 % (ref 0.0–0.2)

## 2024-09-20 LAB — URINALYSIS, ROUTINE W REFLEX MICROSCOPIC
Bilirubin Urine: NEGATIVE
Glucose, UA: NEGATIVE mg/dL
Hgb urine dipstick: NEGATIVE
Ketones, ur: NEGATIVE mg/dL
Leukocytes,Ua: NEGATIVE
Nitrite: NEGATIVE
Protein, ur: NEGATIVE mg/dL
Specific Gravity, Urine: 1.024 (ref 1.005–1.030)
pH: 7 (ref 5.0–8.0)

## 2024-09-20 LAB — LIPASE, BLOOD: Lipase: 32 U/L (ref 11–51)

## 2024-09-20 MED ORDER — ALUM & MAG HYDROXIDE-SIMETH 200-200-20 MG/5ML PO SUSP
30.0000 mL | Freq: Once | ORAL | Status: AC
  Administered 2024-09-20: 30 mL via ORAL
  Filled 2024-09-20: qty 30

## 2024-09-20 MED ORDER — OMEPRAZOLE MAGNESIUM 20 MG PO TBEC: 20.0000 mg | DELAYED_RELEASE_TABLET | Freq: Every day | ORAL | 1 refills | Status: AC

## 2024-09-20 MED ORDER — FAMOTIDINE 20 MG PO TABS: 20.0000 mg | ORAL_TABLET | Freq: Two times a day (BID) | ORAL | 1 refills | Status: AC

## 2024-09-20 MED ORDER — IOHEXOL 300 MG/ML  SOLN
100.0000 mL | Freq: Once | INTRAMUSCULAR | Status: AC | PRN
  Administered 2024-09-20: 100 mL via INTRAVENOUS

## 2024-09-20 NOTE — ED Provider Notes (Signed)
 "  [facility] Provider Note    Event Date/Time   First MD Initiated Contact with Patient 09/20/24 [phone]     (approximate)   History   Abdominal Pain   HPI  Clinton Lane is a 24 y.o. male who presents today for evaluation of right sided abdominal pain that has been ongoing for the last couple of days.  He reports that it is upper and lower.  It is nonradiating.  He reports nausea but has not had any vomiting.  No diarrhea or changes in his stool.  No fevers or chills.  No cough or congestion.  No history of abdominal surgery.  No change in pain with eating, except reports that he has a sensitive stomach when it comes to spicy and acidic foods, and this makes his pain worse.  There are no active problems to display for this patient.         Physical Exam   Triage Vital Signs: ED Triage Vitals  Encounter Vitals Group     BP 09/20/24 0905 128/79     Girls Systolic BP Percentile --      Girls Diastolic BP Percentile --      Boys Systolic BP Percentile --      Boys Diastolic BP Percentile --      Pulse Rate 09/20/24 0905 70     Resp 09/20/24 0905 20     Temp 09/20/24 0905 98.1 F (36.7 C)     Temp Source 09/20/24 0905 Oral     SpO2 09/20/24 0905 97 %     Weight 09/20/24 0903 180 lb (81.6 kg)     Height 09/20/24 0903 6' (1.829 m)     Head Circumference --      Peak Flow --      Pain Score 09/20/24 0903 4     Pain Loc --      Pain Education --      Exclude from Growth Chart --     Most recent vital signs: Vitals:   09/20/24 0905 09/20/24 0956  BP: 128/79   Pulse: 70   Resp: 20   Temp: 98.1 F (36.7 C)   SpO2: 97% 97%    Physical Exam Vitals and nursing note reviewed.  Constitutional:      General: Awake and alert. No acute distress.    Appearance: Normal appearance. The patient is normal weight.  HENT:     Head: Normocephalic and atraumatic.     Mouth: Mucous membranes are moist.  Eyes:     General: PERRL. Normal EOMs         Right eye: No discharge.        Left eye: No discharge.     Conjunctiva/sclera: Conjunctivae normal.  Cardiovascular:     Rate and Rhythm: Normal rate and regular rhythm.     Pulses: Normal pulses.  Pulmonary:     Effort: Pulmonary effort is normal. No respiratory distress.     Breath sounds: Normal breath sounds.  Abdominal:     Abdomen is soft. There is mild epigastric abdominal tenderness. No rebound or guarding. No distention. Musculoskeletal:        General: No swelling. Normal range of motion.     Cervical back: Normal range of motion and neck supple.  Skin:    General: Skin is warm and dry.     Capillary Refill: Capillary refill takes less than 2 seconds.     Findings: No rash.  Neurological:  Mental Status: The patient is awake and alert.      ED Results / Procedures / Treatments   Labs (all labs ordered are listed, but only abnormal results are displayed) Labs Reviewed  URINALYSIS, ROUTINE W REFLEX MICROSCOPIC - Abnormal; Notable for the following components:      Result Value   Color, Urine YELLOW (*)    APPearance CLOUDY (*)    All other components within normal limits  LIPASE, BLOOD  COMPREHENSIVE METABOLIC PANEL WITH GFR  CBC     EKG     RADIOLOGY I independently reviewed and interpreted imaging and agree with radiologists findings.     PROCEDURES:  Critical Care performed:   Procedures   MEDICATIONS ORDERED IN ED: Medications  iohexol  (OMNIPAQUE ) 300 MG/ML solution 100 mL (100 mLs Intravenous Contrast Given 09/20/24 1037)  alum & mag hydroxide-simeth (MAALOX/MYLANTA) 200-200-20 MG/5ML suspension 30 mL (30 mLs Oral Given 09/20/24 1057)     IMPRESSION / MDM / ASSESSMENT AND PLAN / ED COURSE  I reviewed the triage vital signs and the nursing notes.   Differential diagnosis includes, but is not limited to, gastritis, peptic ulcer disease, cholecystitis, biliary colic, appendicitis, diverticulitis, pancreatitis.  Patient is awake and  alert, hemodynamically stable and afebrile.  He is nontoxic in appearance.  Further workup is indicated.  Labs obtained are overall reassuring including no leukocytosis, stable H&H, normal LFTs and lipase.  CT scan obtained and is negative for any acute findings.  No gallstones or gallbladder wall thickening.  He was given a GI cocktail with complete resolution of his symptoms.  Symptoms appear to be most consistent with gastritis.  We discussed dietary modifications and he was started on an H2 blocker and PPI.  Advised that he should follow-up with a gastroenterologist for an endoscopy to symptoms persist.  We discussed return precautions in the meantime.  Patient understands and agrees with plan.  Discharged in stable condition.   Patient's presentation is most consistent with acute complicated illness / injury requiring diagnostic workup.      FINAL CLINICAL IMPRESSION(S) / ED DIAGNOSES   Final diagnoses:  Epigastric pain     Rx / DC Orders   ED Discharge Orders          Ordered    famotidine  (PEPCID ) 20 MG tablet  2 times daily        09/20/24 1120    omeprazole  (PRILOSEC  OTC) 20 MG tablet  Daily        09/20/24 1120             Note:  This document was prepared using Dragon voice recognition software and may include unintentional dictation errors.   Edgel Degnan E, PA-C 09/20/24 1443    [name], MD 09/20/24 1511  "

## 2024-09-20 NOTE — Discharge Instructions (Signed)
 Please take the medications as prescribed.  Please try to avoid irritants to the stomach as we discussed.  Please return for any new, worsening, or changing symptoms or other concerns.  Please follow-up with gastroenterology if your symptoms persist.  It was a pleasure caring for you today.

## 2024-09-20 NOTE — ED Triage Notes (Incomplete)
 Pt via POV from home. Pt c/o RUQ pain for the past week, reports some nausea without vomiting. Denies urinary symptoms. Denies abd surgeries. Pt is A&Ox4 and NAD, ambulatory to triage with steady gait.

## 2024-09-21 ENCOUNTER — Telehealth: Payer: Self-pay | Admitting: Student

## 2024-09-21 MED ORDER — OMEPRAZOLE MAGNESIUM 20 MG PO TBEC: 20.0000 mg | DELAYED_RELEASE_TABLET | Freq: Every day | ORAL | 0 refills | Status: AC

## 2024-09-21 MED ORDER — FAMOTIDINE 20 MG PO TABS: 20.0000 mg | ORAL_TABLET | Freq: Two times a day (BID) | ORAL | 0 refills | Status: AC

## 2024-09-21 NOTE — Telephone Encounter (Cosign Needed)
 Patient called RN to ask for medications to be sent to different pharmacy.  Medicines were sent to [facility] on [address] [facility] per request.
